# Patient Record
Sex: Female | Born: 1985 | Hispanic: Yes | Marital: Married | Smoking: Never smoker
Health system: Southern US, Community
[De-identification: ages and names within clinical notes are randomized; demographics above are authoritative.]

## PROBLEM LIST (undated history)

## (undated) DIAGNOSIS — R87629 Unspecified abnormal cytological findings in specimens from vagina: Secondary | ICD-10-CM

## (undated) HISTORY — PX: BREAST SURGERY: SHX581

## (undated) HISTORY — PX: AUGMENTATION MAMMAPLASTY: SUR837

---

## 2016-02-22 ENCOUNTER — Other Ambulatory Visit (HOSPITAL_COMMUNITY)
Admission: RE | Admit: 2016-02-22 | Discharge: 2016-02-22 | Disposition: A | Discharge status: Home / Self Care | Payer: BLUE CROSS/BLUE SHIELD | Source: Ambulatory Visit | Attending: Obstetrics & Gynecology | Admitting: Obstetrics & Gynecology

## 2016-02-22 ENCOUNTER — Other Ambulatory Visit: Payer: Self-pay | Admitting: Obstetrics & Gynecology

## 2016-02-22 DIAGNOSIS — Z01419 Encounter for gynecological examination (general) (routine) without abnormal findings: Secondary | ICD-10-CM | POA: Diagnosis present

## 2016-02-22 DIAGNOSIS — Z1151 Encounter for screening for human papillomavirus (HPV): Secondary | ICD-10-CM | POA: Diagnosis not present

## 2016-02-27 LAB — CYTOLOGY - PAP
DIAGNOSIS: NEGATIVE
HPV (WINDOPATH): NOT DETECTED

## 2016-03-04 NOTE — L&D Delivery Note (Signed)
Delivery Note Precipitous delivery, performed by Dr. Tenny Craw at 3:53 AM a viable female was delivered via Vaginal, Spontaneous Delivery (Presentation: OA ).  APGAR: 8, ; weight 4 lb 2.5 oz (1885 g).  I was then present for delivery of the placenta. Placenta status: to pathology Cord:  with the following complications: none .  Cord pH: N/A  Anesthesia:  none Episiotomy: None Lacerations: 2nd degree Suture Repair: 2.0 3.0 vicryl Est. Blood Loss (mL): 300  Mom to postpartum.  Baby to NICU.  Myna Hidalgo, M 12/16/2016, 4:16 AM

## 2016-06-12 LAB — OB RESULTS CONSOLE HEPATITIS B SURFACE ANTIGEN: Hepatitis B Surface Ag: NEGATIVE

## 2016-06-12 LAB — OB RESULTS CONSOLE RPR: RPR: NONREACTIVE

## 2016-06-12 LAB — OB RESULTS CONSOLE ABO/RH: RH Type: POSITIVE

## 2016-06-12 LAB — OB RESULTS CONSOLE PLATELET COUNT: Platelets: 210

## 2016-06-12 LAB — OB RESULTS CONSOLE HIV ANTIBODY (ROUTINE TESTING): HIV: NONREACTIVE

## 2016-06-12 LAB — OB RESULTS CONSOLE HGB/HCT, BLOOD
HEMATOCRIT: 37
HEMOGLOBIN: 12.3

## 2016-06-12 LAB — OB RESULTS CONSOLE RUBELLA ANTIBODY, IGM: Rubella: IMMUNE

## 2016-06-12 LAB — OB RESULTS CONSOLE ANTIBODY SCREEN: Antibody Screen: NEGATIVE

## 2016-10-21 LAB — OB RESULTS CONSOLE HGB/HCT, BLOOD
HCT: 32
HEMOGLOBIN: 11.3

## 2016-10-21 LAB — OB RESULTS CONSOLE PLATELET COUNT: PLATELETS: 247

## 2016-12-12 ENCOUNTER — Inpatient Hospital Stay (HOSPITAL_COMMUNITY): Payer: BLUE CROSS/BLUE SHIELD

## 2016-12-12 ENCOUNTER — Encounter (HOSPITAL_COMMUNITY): Payer: Self-pay | Admitting: *Deleted

## 2016-12-12 ENCOUNTER — Inpatient Hospital Stay (HOSPITAL_COMMUNITY)
Admission: AD | Admit: 2016-12-12 | Discharge: 2016-12-19 | DRG: 805 | Disposition: A | Discharge status: Home / Self Care | Payer: BLUE CROSS/BLUE SHIELD | Source: Ambulatory Visit | Attending: Obstetrics & Gynecology | Admitting: Obstetrics & Gynecology

## 2016-12-12 DIAGNOSIS — Z3A33 33 weeks gestation of pregnancy: Secondary | ICD-10-CM

## 2016-12-12 DIAGNOSIS — O42913 Preterm premature rupture of membranes, unspecified as to length of time between rupture and onset of labor, third trimester: Secondary | ICD-10-CM | POA: Diagnosis present

## 2016-12-12 DIAGNOSIS — O42919 Preterm premature rupture of membranes, unspecified as to length of time between rupture and onset of labor, unspecified trimester: Secondary | ICD-10-CM

## 2016-12-12 DIAGNOSIS — O1414 Severe pre-eclampsia complicating childbirth: Secondary | ICD-10-CM | POA: Diagnosis present

## 2016-12-12 HISTORY — DX: Unspecified abnormal cytological findings in specimens from vagina: R87.629

## 2016-12-12 LAB — URINALYSIS, ROUTINE W REFLEX MICROSCOPIC
Bilirubin Urine: NEGATIVE
GLUCOSE, UA: NEGATIVE mg/dL
HGB URINE DIPSTICK: NEGATIVE
KETONES UR: NEGATIVE mg/dL
Leukocytes, UA: NEGATIVE
NITRITE: NEGATIVE
PROTEIN: 100 mg/dL — AB
Specific Gravity, Urine: 1.029 (ref 1.005–1.030)
pH: 5 (ref 5.0–8.0)

## 2016-12-12 LAB — CBC
HEMATOCRIT: 31.5 % — AB (ref 36.0–46.0)
Hemoglobin: 10.7 g/dL — ABNORMAL LOW (ref 12.0–15.0)
MCH: 29.4 pg (ref 26.0–34.0)
MCHC: 34 g/dL (ref 30.0–36.0)
MCV: 86.5 fL (ref 78.0–100.0)
Platelets: 196 10*3/uL (ref 150–400)
RBC: 3.64 MIL/uL — ABNORMAL LOW (ref 3.87–5.11)
RDW: 13.3 % (ref 11.5–15.5)
WBC: 9.3 10*3/uL (ref 4.0–10.5)

## 2016-12-12 LAB — TYPE AND SCREEN
ABO/RH(D): B POS
ANTIBODY SCREEN: NEGATIVE

## 2016-12-12 LAB — POCT FERN TEST: POCT Fern Test: POSITIVE

## 2016-12-12 LAB — ABO/RH: ABO/RH(D): B POS

## 2016-12-12 MED ORDER — SODIUM CHLORIDE 0.9 % IV SOLN: 250.0000 mL | INTRAVENOUS | Status: DC | PRN

## 2016-12-12 MED ORDER — LACTATED RINGERS IV SOLN
INTRAVENOUS | Status: DC
  Administered 2016-12-12 – 2016-12-13 (×2): via INTRAVENOUS

## 2016-12-12 MED ORDER — NIFEDIPINE 10 MG PO CAPS
10.0000 mg | ORAL_CAPSULE | Freq: Four times a day (QID) | ORAL | Status: DC
  Administered 2016-12-13 – 2016-12-14 (×6): 10 mg via ORAL
  Filled 2016-12-12 (×7): qty 1

## 2016-12-12 MED ORDER — AMOXICILLIN 500 MG PO CAPS
500.0000 mg | ORAL_CAPSULE | Freq: Three times a day (TID) | ORAL | Status: DC
  Administered 2016-12-14 – 2016-12-15 (×2): 500 mg via ORAL
  Filled 2016-12-12 (×3): qty 1

## 2016-12-12 MED ORDER — ZOLPIDEM TARTRATE 5 MG PO TABS: 5.0000 mg | ORAL_TABLET | Freq: Every evening | ORAL | Status: DC | PRN

## 2016-12-12 MED ORDER — SODIUM CHLORIDE 0.9% FLUSH
3.0000 mL | Freq: Two times a day (BID) | INTRAVENOUS | Status: DC
  Administered 2016-12-13 – 2016-12-14 (×3): 3 mL via INTRAVENOUS

## 2016-12-12 MED ORDER — ACETAMINOPHEN 325 MG PO TABS: 650.0000 mg | ORAL_TABLET | ORAL | Status: DC | PRN

## 2016-12-12 MED ORDER — BETAMETHASONE SOD PHOS & ACET 6 (3-3) MG/ML IJ SUSP
12.0000 mg | INTRAMUSCULAR | Status: AC
  Administered 2016-12-12 – 2016-12-13 (×2): 12 mg via INTRAMUSCULAR
  Filled 2016-12-12 (×2): qty 2

## 2016-12-12 MED ORDER — NIFEDIPINE 10 MG PO CAPS
20.0000 mg | ORAL_CAPSULE | Freq: Once | ORAL | Status: AC
  Administered 2016-12-12: 20 mg via ORAL
  Filled 2016-12-12: qty 2

## 2016-12-12 MED ORDER — SODIUM CHLORIDE 0.9 % IV SOLN
2.0000 g | Freq: Four times a day (QID) | INTRAVENOUS | Status: AC
  Administered 2016-12-12 – 2016-12-14 (×8): 2 g via INTRAVENOUS
  Filled 2016-12-12 (×8): qty 2000

## 2016-12-12 MED ORDER — AZITHROMYCIN 250 MG PO TABS
500.0000 mg | ORAL_TABLET | Freq: Every day | ORAL | Status: DC
  Administered 2016-12-12 – 2016-12-14 (×3): 500 mg via ORAL
  Filled 2016-12-12 (×3): qty 2

## 2016-12-12 MED ORDER — CALCIUM CARBONATE ANTACID 500 MG PO CHEW
2.0000 | CHEWABLE_TABLET | ORAL | Status: DC | PRN
  Administered 2016-12-15: 400 mg via ORAL
  Filled 2016-12-12: qty 2

## 2016-12-12 MED ORDER — PRENATAL MULTIVITAMIN CH
1.0000 | ORAL_TABLET | Freq: Every day | ORAL | Status: DC
  Administered 2016-12-13: 1 via ORAL
  Filled 2016-12-12 (×2): qty 1

## 2016-12-12 MED ORDER — DOCUSATE SODIUM 100 MG PO CAPS
100.0000 mg | ORAL_CAPSULE | Freq: Every day | ORAL | Status: DC
  Administered 2016-12-13 – 2016-12-14 (×2): 100 mg via ORAL
  Filled 2016-12-12 (×2): qty 1

## 2016-12-12 MED ORDER — SODIUM CHLORIDE 0.9% FLUSH: 3.0000 mL | INTRAVENOUS | Status: DC | PRN

## 2016-12-12 NOTE — MAU Note (Signed)
Patient was at home on the couch when she felt a gush of clear fluid.  It soaked through her clothes onto the couch.  Patient was instructed to come to MAU for evaluation.  No pain.  Reports good fetal movement.  Felt another gush in triage.

## 2016-12-12 NOTE — Consult Note (Signed)
Neonatology Consult  Note:  At the request of the patients obstetrician Dr. Nelda Marseille I met with Karen Quinn (and husband) who is at 25 4 weeks currently with pregancy complicated by PPROM today.   We reviewed initial delivery room management, including CPAP, [place], and low but certainly possible need for intubation for surfactant administration.  We discussed feeding immaturity and need for full po intake with multiple days of good weight gain and no apnea or bradycardia before discharge.  We reviewed increased risk of jaundice, infection, and temperature instability.   Discussed likely length of stay.  Thank you for allowing [place] to participate in her care.  Please call with questions.  Higinio Roger, DO  Neonatologist   The total length of face-to-face or floor / unit time for this encounter was 20 minutes.  Counseling and / or coordination of care was greater than fifty percent of the time.

## 2016-12-12 NOTE — H&P (Signed)
HPI: 31 y/o G1P0 @ [redacted]w[redacted]d estimated gestational age (as dated by LMP c/w 20 week ultrasound) presents complaining of rupture and gush of fluid around 1330 today.  Since then she has also noted some irregular contractions, maybe every 10-66min, but seem to have started to space out. no Vaginal Bleeding,   + Fetal Movement.  Prenatal care has been provided by Dr. Charlotta Newton  ROS:  CONSTITUTIONAL: No fever or chills HEENT: no headache, no blurr vision CARDIOLOGY: No chest pain.  No peripheral edema. RESPIRATORY: no Shortness of breath. no Cough.  UROLOGY: no Urinary frequency. no Urinary incontinence. no Urinary urgency.  GASTROENTEROLOGY: no Abdominal pain. no Appetite change. no Change in bowel movements.  FEMALE REPRODUCTIVE: no Breast lumps or discharge. no Breast pain. NEUROLOGY: no Dizziness.  no Loss of consciousness.  PSYCHOLOGY: no Anxiety. no Depression.  SKIN: no Rash. no Hives.    Pregnancy uncomplicated   Prenatal Transfer Tool  Maternal Diabetes: No Genetic Screening: Normal Maternal Ultrasounds/Referrals: Normal Fetal Ultrasounds or other Referrals:  None Maternal Substance Abuse:  No Significant Maternal Medications:  None Significant Maternal Lab Results: Lab values include: Other: none   PNL:  GBS unknown, Rub Immune, Hep B neg, RPR NR, HIV neg, GC/C neg, glucola:137 Hgb: 11.3 Blood type: B positive, antibody neg  Immunizations: Tdap: 9/25 Flu: declined  OBHx: primip PMHx:  none Meds:  PNV Allergy:  No Known Allergies SurgHx: none SocHx:   no Tobacco, no  EtOH, no Illicit Drugs  O: BP 120/72 (BP Location: Left Arm)   Pulse 63   Temp 98.2 F (36.8 C) (Oral)   Resp 16   Ht 5' (1.524 m)   Wt 128 lb (58.1 kg)   SpO2 100%   BMI 25.00 kg/m  Gen. AAOx3, NAD Neck: normal appearance CV.  RRR Resp. Normal respiratory rate and effort Abd. Gravid,  no tenderness,  no rigidity,  no guarding Extr.  no edema B/L , no calf tenderness, neg Homan's B/L  FHT: 130  baseline, moderate variability, + accels,  no decels Toco: irregular- q 3-69min SVE: deferred  SSE: per MAU- grossly ruptured  Labs: see orders  A/P:  31 y.o. G1P0 @ [redacted]w[redacted]d EGA who presents for PPROM -FWB:  Cat. I, plan for continuous monitoring for first 24-48hr -PPROM:   Started on antibiotic course- IV Amox and Azithro  Plan for betamethasone course- first shot @ 1700  Procardia given for tocolysis  Continue IV fluids, if contractions space out ok to saline lock  UA and GBS collected  Currently no evidence of infection  [place] ordered for fetal growth and to assess fluid level -s/p NICU consult  DISPO: Expectant management, plan for delivery at 34wks or if clinically indicated  Myna Hidalgo, DO [phone] (pager) [phone] (office)

## 2016-12-12 NOTE — MAU Provider Note (Signed)
S: Ms. Karen Quinn is a 31 y.o. G1P0 at [redacted]w[redacted]d  who presents to MAU today complaining of leaking of fluid since 1330 today. She denies vaginal bleeding. She denies contractions. She reports normal fetal movement.    O: BP 122/79   Pulse 78   Temp 98.4 F (36.9 C) (Oral)   Resp 16   Ht 5' (1.524 m)   Wt 128 lb (58.1 kg)   SpO2 96%   BMI 25.00 kg/m  GENERAL: Well-developed, well-nourished female in no acute distress.  HEAD: Normocephalic, atraumatic.  CHEST: Normal effort of breathing, regular heart rate ABDOMEN: Soft, nontender, gravid PELVIC: Normal external female genitalia. Vagina is pink and rugated. Cervix with normal contour, no lesions. Normal discharge.  Positive pooling; moderate amount of clear watery fluid.   Cervical exam: deferred     Fetal Monitoring: Baseline: 135 Variability: moderate Accelerations: 15x15 Decelerations: none Contractions: irr ctx/ui  Results for orders placed or performed during the hospital encounter of 12/12/16 (from the past 24 hour(s))  Urinalysis, Routine w reflex microscopic     Status: Abnormal   Collection Time: 12/12/16  2:55 PM  Result Value Ref Range   Color, Urine YELLOW YELLOW   APPearance HAZY (A) CLEAR   Specific Gravity, Urine 1.029 1.005 - 1.030   pH 5.0 5.0 - 8.0   Glucose, UA NEGATIVE NEGATIVE mg/dL   Hgb urine dipstick NEGATIVE NEGATIVE   Bilirubin Urine NEGATIVE NEGATIVE   Ketones, ur NEGATIVE NEGATIVE mg/dL   Protein, ur 409 (A) NEGATIVE mg/dL   Nitrite NEGATIVE NEGATIVE   Leukocytes, UA NEGATIVE NEGATIVE   RBC / HPF 0-5 0 - 5 RBC/hpf   WBC, UA 0-5 0 - 5 WBC/hpf   Bacteria, UA RARE (A) NONE SEEN   Squamous Epithelial / LPF 0-5 (A) NONE SEEN   Mucus PRESENT   Fern Test     Status: None   Collection Time: 12/12/16  3:59 PM  Result Value Ref Range   POCT Fern Test Positive = ruptured amniotic membanes      A: SIUP at [redacted]w[redacted]d  1. Preterm premature rupture of membranes    P: Dr. Charlotta Newton notified of exam. Will  place admission orders due to PPROM.  Discussed results with patient  Judeth Horn, NP 12/12/2016 3:29 PM

## 2016-12-13 NOTE — Progress Notes (Signed)
HD #1- PPROM at [redacted]w[redacted]d, currently [redacted]w[redacted]d  S: Patient resting comfortable.  No contractions, no VB, +FM.  Still leaking fluid.  No fever/chills.  Tolerating gen diet.  No acute complaints.  O: BP (!) 101/59 (BP Location: Left Arm)   Pulse 79   Temp 98 F (36.7 C) (Oral)   Resp 17   Ht 5' (1.524 m)   Wt 128 lb (58.1 kg)   SpO2 99%   BMI 25.00 kg/m   Gen: NAD CV: RRR Lungs: CTAB Abd: gravid,non-tender Ext: SCDs in place, no edema, no calf tenderness  Fetal tracings: 120, moderate variability, +accels, no decels Contractions: none   A: 31yo G1P0@ [redacted]w[redacted]d for PPROM @ [redacted]w[redacted]d -stable, no evidence of chorioamnionitis -Fetal well being reassuring   Plan: -currently on antibiotic therapy -Tocolysis- Procardia -Betamethosone course- 2nd dose today -will transition to q shift monitoring as FHT have been reassuring overnight -S/p NICU consult  Continue expectant management, plan for delivery at 34wk or pending discussion with MFM  Myna Hidalgo, DO [phone] (pager) [phone] (office)

## 2016-12-14 LAB — CULTURE, BETA STREP (GROUP B ONLY)

## 2016-12-14 NOTE — Progress Notes (Signed)
HD #2- PPROM at [redacted]w[redacted]d, currently [redacted]w[redacted]d  S: Patient resting comfortable.  No contractions, no VB, +FM.  Still leaking fluid.  No fever/chills.  Tolerating gen diet.  No acute complaints or changes overnight  O: BP 109/70 (BP Location: Right Arm)   Pulse 84   Temp 99.1 F (37.3 C) (Oral)   Resp 16   Ht 5' (1.524 m)   Wt 128 lb (58.1 kg)   SpO2 98%   BMI 25.00 kg/m   Gen: NAD CV: RRR Lungs: CTAB Abd: gravid,non-tender Ext: SCDs in place, no edema, no calf tenderness  Fetal tracings: 125, moderate variability, +accels, no decels Contractions: occasional SVE: Deferred   A: 31yo G1P0@ [redacted]w[redacted]d for PPROM  -stable, no evidence of chorioamnionitis -Fetal well being reassuring   Plan: -currently on antibiotic therapy -s/p tocolysis -s/p betamethasone course -NST q shift -S/p NICU consult  Continue expectant management, plan for delivery at 34wk   Myna Hidalgo, DO [phone] (pager) [phone] (office)

## 2016-12-14 NOTE — Plan of Care (Signed)
Problem: Education: Goal: Knowledge of disease or condition will improve Outcome: Progressing Patient knowledgeable of potential delivery tomorrow at 34 weeks.    Problem: Coping: Goal: Level of anxiety will decrease Outcome: Not Progressing Patient tearful after discussion with Dr. Nelda Marseille regarding potential delivery tomorrow.  Goal: Ability to identify and develop effective coping behavior will improve Outcome: Progressing Patient tearful but demonstrating adequate coping behaviors regarding condition.  Goal: Participation in decision-making will improve Outcome: Progressing Patient and FOB actively participating in decision making progress.  Upon assessment, patient was currently thinking over possible delivery tomorrow (per Dr. Nelda Marseille).   Problem: Physical Regulation: Goal: Complications related to the disease process, condition or treatment will be avoided or minimized Outcome: Progressing Monitoring vaginal discharge.  Patient reports pinkish clear fluid loss.   Problem: Pain Management: Goal: Relief or control of pain will improve Outcome: Completed/Met Date Met: 12/14/16 Patient denies pain

## 2016-12-15 LAB — CBC
HEMATOCRIT: 24.8 % — AB (ref 36.0–46.0)
Hemoglobin: 8.3 g/dL — ABNORMAL LOW (ref 12.0–15.0)
MCH: 29.5 pg (ref 26.0–34.0)
MCHC: 33.5 g/dL (ref 30.0–36.0)
MCV: 88.3 fL (ref 78.0–100.0)
Platelets: 156 10*3/uL (ref 150–400)
RBC: 2.81 MIL/uL — ABNORMAL LOW (ref 3.87–5.11)
RDW: 13.7 % (ref 11.5–15.5)
WBC: 9 10*3/uL (ref 4.0–10.5)

## 2016-12-15 LAB — TYPE AND SCREEN
ABO/RH(D): B POS
ANTIBODY SCREEN: NEGATIVE

## 2016-12-15 MED ORDER — LACTATED RINGERS IV SOLN: 500.0000 mL | INTRAVENOUS | Status: DC | PRN

## 2016-12-15 MED ORDER — OXYCODONE-ACETAMINOPHEN 5-325 MG PO TABS: 2.0000 | ORAL_TABLET | ORAL | Status: DC | PRN

## 2016-12-15 MED ORDER — OXYTOCIN 40 UNITS IN LACTATED RINGERS INFUSION - SIMPLE MED
1.0000 m[IU]/min | INTRAVENOUS | Status: DC
  Administered 2016-12-15: 2 m[IU]/min via INTRAVENOUS

## 2016-12-15 MED ORDER — LACTATED RINGERS IV SOLN
INTRAVENOUS | Status: DC
  Administered 2016-12-15: 10:00:00 via INTRAVENOUS

## 2016-12-15 MED ORDER — LIDOCAINE HCL (PF) 1 % IJ SOLN
30.0000 mL | INTRAMUSCULAR | Status: AC | PRN
  Administered 2016-12-16: 30 mL via SUBCUTANEOUS
  Filled 2016-12-15: qty 30

## 2016-12-15 MED ORDER — MISOPROSTOL 200 MCG PO TABS
50.0000 ug | ORAL_TABLET | ORAL | Status: DC | PRN
  Administered 2016-12-15 (×3): 50 ug via ORAL
  Filled 2016-12-15 (×3): qty 1

## 2016-12-15 MED ORDER — ONDANSETRON HCL 4 MG/2ML IJ SOLN
4.0000 mg | Freq: Four times a day (QID) | INTRAMUSCULAR | Status: DC | PRN
Start: 2016-12-15 — End: 2016-12-16

## 2016-12-15 MED ORDER — FAMOTIDINE IN NACL 20-0.9 MG/50ML-% IV SOLN
20.0000 mg | Freq: Once | INTRAVENOUS | Status: AC
  Administered 2016-12-15: 20 mg via INTRAVENOUS
  Filled 2016-12-15: qty 50

## 2016-12-15 MED ORDER — SOD CITRATE-CITRIC ACID 500-334 MG/5ML PO SOLN: 30.0000 mL | ORAL | Status: DC | PRN

## 2016-12-15 MED ORDER — OXYTOCIN 40 UNITS IN LACTATED RINGERS INFUSION - SIMPLE MED
2.5000 [IU]/h | INTRAVENOUS | Status: DC
  Administered 2016-12-16: 2.5 [IU]/h via INTRAVENOUS
  Filled 2016-12-15: qty 1000

## 2016-12-15 MED ORDER — ACETAMINOPHEN 325 MG PO TABS: 650.0000 mg | ORAL_TABLET | ORAL | Status: DC | PRN

## 2016-12-15 MED ORDER — ZOLPIDEM TARTRATE 5 MG PO TABS: 5.0000 mg | ORAL_TABLET | Freq: Every evening | ORAL | Status: DC | PRN

## 2016-12-15 MED ORDER — TERBUTALINE SULFATE 1 MG/ML IJ SOLN: 0.2500 mg | Freq: Once | INTRAMUSCULAR | Status: DC | PRN

## 2016-12-15 MED ORDER — OXYCODONE-ACETAMINOPHEN 5-325 MG PO TABS: 1.0000 | ORAL_TABLET | ORAL | Status: DC | PRN

## 2016-12-15 MED ORDER — OXYTOCIN BOLUS FROM INFUSION
500.0000 mL | Freq: Once | INTRAVENOUS | Status: AC
  Administered 2016-12-16: 500 mL via INTRAVENOUS

## 2016-12-15 NOTE — Anesthesia Pain Management Evaluation Note (Signed)
  CRNA Pain Management Visit Note  Patient: Karen Quinn, 31 y.o., female  "Hello I am a member of the anesthesia team at [facility]. We have an anesthesia team available at all times to provide care throughout the hospital, including epidural management and anesthesia for C-section. I don't know your plan for the delivery whether it a natural birth, water birth, IV sedation, nitrous supplementation, doula or epidural, but we want to meet your pain goals."   1.Was your pain managed to your expectations on prior hospitalizations?   No prior hospitalizations  2.What is your expectation for pain management during this hospitalization?     Labor support without medications and Epidural  3.How can we help you reach that goal? Patient would like to try natural, but is open to epidural  Record the patient's initial score and the patient's pain goal.   Pain: 0  Pain Goal: 10 [facility] wants you to be able to say your pain was always managed very well.  Rica Records 12/15/2016

## 2016-12-15 NOTE — Progress Notes (Signed)
Pt placed on monitor at 2156. Monitor continuously picking up mom's HR. Monitor switched out and place patient on different monitor for a more accurate tracing. Pt began to get tearful and worried. Emotional support given.  Reassured that baby was fine. Charge nurse notified and came in to assist when monitor was picking up HR in the 60s which was the patient's heart rate. Verified by radial pulse. FOB at bedside. Pt resting at this time. No acute distress noted. Will continue to monitor.

## 2016-12-15 NOTE — Progress Notes (Signed)
OB PN:  S: Pt resting comfortably, no acute complaints  O: BP 139/84 (BP Location: Right Arm)   Pulse 64   Temp 98.7 F (37.1 C) (Oral)   Resp 18   Ht 5' (1.524 m)   Wt 128 lb (58.1 kg)   SpO2 99%   BMI 25.00 kg/m   FHT: 135bpm, moderate variablity, + accels, no decels Toco: rare SVE: deferred  A/P: 31 y.o. G1P0 @ [redacted]w[redacted]d for IOL due to PPROM 1. FWB: Cat. I 2. Labor: plan for cytotec for cervical ripening Pain: IV or epidural upon request GBS: neg  Myna Hidalgo, DO [phone] (pager) [phone] (office)

## 2016-12-15 NOTE — Progress Notes (Addendum)
HD #2- PPROM at [redacted]w[redacted]d, currently [redacted]w[redacted]d  S: Patient resting comfortable.  No contractions, no VB, +FM.  Still leaking fluid.  No fever/chills.  Tolerating gen diet.  No acute complaints.  O: BP (!) 143/76 (BP Location: Right Arm) Comment: RN Notified  Pulse (!) 59 Comment: RN Notified  Temp 98.2 F (36.8 C) (Oral)   Resp 18   Ht 5' (1.524 m)   Wt 128 lb (58.1 kg)   SpO2 99%   BMI 25.00 kg/m   Gen: NAD CV: RRR Lungs: CTAB Abd: gravid,non-tender Ext: SCDs in place, no edema, no calf tenderness  Fetal tracings: 125, moderate variability, +accels, no decels Contractions: mild irritability   A: 31yo G1P0@ [redacted]w[redacted]d for PPROM @ [redacted]w[redacted]d -stable, no evidence of chorioamnionitis -Fetal well being reassuring  Plan: -s/p IV antibiotics, currently on oral antibiotics -s/p tocolysis -s/p betamethasone course -NST q shift -S/p NICU consult  At bedside to discuss plan of care- discussed that guidelines suggest to proceed with delivery at 34wk.  Risk, benefit and alternatives discussed with patient and her husband.  Questions and concerns were addressed regarding Pitocin, induction and what it would mean if they decided to wait.  Discussed with patient that while she currently does not have evidence of infection and that she has not been ruptured for a prolonged period (greater than a week), it does not mean that an infection may not be present as this is often the cause of early rupture.  Again reviewed pros/cons of delivery @ 34wks in terms of the baby and reminded pt that at this point she has already received BMZ to help the fetal lungs mature.  Pt and her husband wish to think more about induction at this time.  Will wait to hear from them regarding their decision.  ADDENDUM: They have decided to proceed with IOL.  CBC, T&S to be collected.  Plan for IOL with oral cytotec  Myna Hidalgo, DO [phone] (cell) [phone] (office)

## 2016-12-16 ENCOUNTER — Encounter (HOSPITAL_COMMUNITY): Payer: Self-pay | Admitting: Anesthesiology

## 2016-12-16 ENCOUNTER — Encounter (HOSPITAL_COMMUNITY): Payer: Self-pay | Admitting: *Deleted

## 2016-12-16 LAB — COMPREHENSIVE METABOLIC PANEL
ALBUMIN: 2.3 g/dL — AB (ref 3.5–5.0)
ALBUMIN: 2.1 g/dL — AB (ref 3.5–5.0)
ALK PHOS: 149 U/L — AB (ref 38–126)
ALT: 164 U/L — ABNORMAL HIGH (ref 14–54)
ALT: 174 U/L — ABNORMAL HIGH (ref 14–54)
ALT: 195 U/L — AB (ref 14–54)
ANION GAP: 7 (ref 5–15)
ANION GAP: 9 (ref 5–15)
ANION GAP: 9 (ref 5–15)
AST: 154 U/L — ABNORMAL HIGH (ref 15–41)
AST: 160 U/L — ABNORMAL HIGH (ref 15–41)
AST: 158 U/L — AB (ref 15–41)
Albumin: 2.7 g/dL — ABNORMAL LOW (ref 3.5–5.0)
Alkaline Phosphatase: 163 U/L — ABNORMAL HIGH (ref 38–126)
Alkaline Phosphatase: 135 U/L — ABNORMAL HIGH (ref 38–126)
BILIRUBIN TOTAL: 0.4 mg/dL (ref 0.3–1.2)
BILIRUBIN TOTAL: 0.4 mg/dL (ref 0.3–1.2)
BUN: 11 mg/dL (ref 6–20)
BUN: 12 mg/dL (ref 6–20)
BUN: 12 mg/dL (ref 6–20)
CALCIUM: 6.8 mg/dL — AB (ref 8.9–10.3)
CALCIUM: 8.2 mg/dL — AB (ref 8.9–10.3)
CO2: 18 mmol/L — ABNORMAL LOW (ref 22–32)
CO2: 19 mmol/L — ABNORMAL LOW (ref 22–32)
CO2: 21 mmol/L — ABNORMAL LOW (ref 22–32)
CREATININE: 0.55 mg/dL (ref 0.44–1.00)
Calcium: 7.9 mg/dL — ABNORMAL LOW (ref 8.9–10.3)
Chloride: 108 mmol/L (ref 101–111)
Chloride: 107 mmol/L (ref 101–111)
Chloride: 106 mmol/L (ref 101–111)
Creatinine, Ser: 0.56 mg/dL (ref 0.44–1.00)
Creatinine, Ser: 0.48 mg/dL (ref 0.44–1.00)
GFR calc Af Amer: >60 mL/min (ref >60)
GFR calc Af Amer: >60 mL/min (ref >60)
GFR calc Af Amer: >60 mL/min (ref >60)
GFR calc non Af Amer: >60 mL/min (ref >60)
GLUCOSE: 82 mg/dL (ref 65–99)
GLUCOSE: 89 mg/dL (ref 65–99)
Glucose, Bld: 94 mg/dL (ref 65–99)
POTASSIUM: 3.4 mmol/L — AB (ref 3.5–5.1)
Potassium: 3.8 mmol/L (ref 3.5–5.1)
Potassium: 3.5 mmol/L (ref 3.5–5.1)
SODIUM: 134 mmol/L — AB (ref 135–145)
SODIUM: 135 mmol/L (ref 135–145)
Sodium: 135 mmol/L (ref 135–145)
TOTAL PROTEIN: 4.5 g/dL — AB (ref 6.5–8.1)
TOTAL PROTEIN: 4.8 g/dL — AB (ref 6.5–8.1)
TOTAL PROTEIN: 5.1 g/dL — AB (ref 6.5–8.1)
Total Bilirubin: 0.3 mg/dL (ref 0.3–1.2)

## 2016-12-16 LAB — CBC
HEMATOCRIT: 24.1 % — AB (ref 36.0–46.0)
HEMATOCRIT: 26.9 % — AB (ref 36.0–46.0)
HEMOGLOBIN: 8.2 g/dL — AB (ref 12.0–15.0)
HEMOGLOBIN: 9 g/dL — AB (ref 12.0–15.0)
MCH: 29.1 pg (ref 26.0–34.0)
MCH: 29.7 pg (ref 26.0–34.0)
MCHC: 33.5 g/dL (ref 30.0–36.0)
MCHC: 34 g/dL (ref 30.0–36.0)
MCV: 87.1 fL (ref 78.0–100.0)
MCV: 87.3 fL (ref 78.0–100.0)
Platelets: 168 10*3/uL (ref 150–400)
Platelets: 151 10*3/uL (ref 150–400)
RBC: 2.76 MIL/uL — AB (ref 3.87–5.11)
RBC: 3.09 MIL/uL — AB (ref 3.87–5.11)
RDW: 13.7 % (ref 11.5–15.5)
RDW: 13.6 % (ref 11.5–15.5)
WBC: 10.1 10*3/uL (ref 4.0–10.5)
WBC: 9.5 10*3/uL (ref 4.0–10.5)

## 2016-12-16 LAB — RPR: RPR: NONREACTIVE

## 2016-12-16 LAB — URIC ACID: Uric Acid, Serum: 4.4 mg/dL (ref 2.3–6.6)

## 2016-12-16 LAB — PROTEIN / CREATININE RATIO, URINE
Creatinine, Urine: 106 mg/dL
Protein Creatinine Ratio: 4.81 mg/mg{Cre} — ABNORMAL HIGH (ref 0.00–0.15)
Total Protein, Urine: 510 mg/dL

## 2016-12-16 LAB — MAGNESIUM: Magnesium: 4.9 mg/dL — ABNORMAL HIGH (ref 1.7–2.4)

## 2016-12-16 MED ORDER — PRENATAL MULTIVITAMIN CH
1.0000 | ORAL_TABLET | Freq: Every day | ORAL | Status: DC
  Administered 2016-12-16 – 2016-12-18 (×3): 1 via ORAL
  Filled 2016-12-16 (×3): qty 1

## 2016-12-16 MED ORDER — MAGNESIUM SULFATE BOLUS VIA INFUSION
4.0000 g | Freq: Once | INTRAVENOUS | Status: AC
  Administered 2016-12-16: 4 g via INTRAVENOUS
  Filled 2016-12-16: qty 500

## 2016-12-16 MED ORDER — ONDANSETRON HCL 4 MG/2ML IJ SOLN: 4.0000 mg | INTRAMUSCULAR | Status: DC | PRN

## 2016-12-16 MED ORDER — PHENYLEPHRINE 40 MCG/ML (10ML) SYRINGE FOR IV PUSH (FOR BLOOD PRESSURE SUPPORT)
PREFILLED_SYRINGE | INTRAVENOUS | Status: AC
  Filled 2016-12-16: qty 10

## 2016-12-16 MED ORDER — BENZOCAINE-MENTHOL 20-0.5 % EX AERO
1.0000 "application " | INHALATION_SPRAY | CUTANEOUS | Status: DC | PRN
  Administered 2016-12-16: 1 via TOPICAL
  Filled 2016-12-16: qty 56

## 2016-12-16 MED ORDER — ONDANSETRON HCL 4 MG PO TABS: 4.0000 mg | ORAL_TABLET | ORAL | Status: DC | PRN

## 2016-12-16 MED ORDER — MISOPROSTOL 200 MCG PO TABS
ORAL_TABLET | ORAL | Status: AC
  Filled 2016-12-16: qty 4

## 2016-12-16 MED ORDER — EPHEDRINE 5 MG/ML INJ
10.0000 mg | INTRAVENOUS | Status: DC | PRN
  Filled 2016-12-16: qty 2

## 2016-12-16 MED ORDER — DIPHENHYDRAMINE HCL 25 MG PO CAPS: 25.0000 mg | ORAL_CAPSULE | Freq: Four times a day (QID) | ORAL | Status: DC | PRN

## 2016-12-16 MED ORDER — FENTANYL 2.5 MCG/ML BUPIVACAINE 1/10 % EPIDURAL INFUSION (WH - ANES)
INTRAMUSCULAR | Status: DC
Start: 2016-12-16 — End: 2016-12-16
  Filled 2016-12-16: qty 100

## 2016-12-16 MED ORDER — DIPHENHYDRAMINE HCL 50 MG/ML IJ SOLN: 12.5000 mg | INTRAMUSCULAR | Status: DC | PRN

## 2016-12-16 MED ORDER — FENTANYL 2.5 MCG/ML BUPIVACAINE 1/10 % EPIDURAL INFUSION (WH - ANES): 14.0000 mL/h | INTRAMUSCULAR | Status: DC | PRN

## 2016-12-16 MED ORDER — SIMETHICONE 80 MG PO CHEW
80.0000 mg | CHEWABLE_TABLET | ORAL | Status: DC | PRN
  Administered 2016-12-17: 80 mg via ORAL
  Filled 2016-12-16: qty 1

## 2016-12-16 MED ORDER — IBUPROFEN 600 MG PO TABS
600.0000 mg | ORAL_TABLET | Freq: Four times a day (QID) | ORAL | Status: DC
  Administered 2016-12-16 – 2016-12-19 (×12): 600 mg via ORAL
  Filled 2016-12-16 (×13): qty 1

## 2016-12-16 MED ORDER — ACETAMINOPHEN 325 MG PO TABS
650.0000 mg | ORAL_TABLET | ORAL | Status: DC | PRN
  Administered 2016-12-16: 650 mg via ORAL
  Filled 2016-12-16: qty 2

## 2016-12-16 MED ORDER — MAGNESIUM SULFATE 40 G IN LACTATED RINGERS - SIMPLE
2.0000 g/h | INTRAVENOUS | Status: AC
  Administered 2016-12-16: 2 g/h via INTRAVENOUS
  Filled 2016-12-16: qty 40
  Filled 2016-12-16: qty 500

## 2016-12-16 MED ORDER — PHENYLEPHRINE 40 MCG/ML (10ML) SYRINGE FOR IV PUSH (FOR BLOOD PRESSURE SUPPORT)
80.0000 ug | PREFILLED_SYRINGE | INTRAVENOUS | Status: DC | PRN
  Filled 2016-12-16: qty 5

## 2016-12-16 MED ORDER — WITCH HAZEL-GLYCERIN EX PADS: 1.0000 "application " | MEDICATED_PAD | CUTANEOUS | Status: DC | PRN

## 2016-12-16 MED ORDER — LABETALOL HCL 5 MG/ML IV SOLN
20.0000 mg | INTRAVENOUS | Status: DC | PRN
  Administered 2016-12-18: 20 mg via INTRAVENOUS
  Filled 2016-12-16 (×2): qty 4

## 2016-12-16 MED ORDER — DIBUCAINE 1 % RE OINT: 1.0000 "application " | TOPICAL_OINTMENT | RECTAL | Status: DC | PRN

## 2016-12-16 MED ORDER — ZOLPIDEM TARTRATE 5 MG PO TABS: 5.0000 mg | ORAL_TABLET | Freq: Every evening | ORAL | Status: DC | PRN

## 2016-12-16 MED ORDER — COCONUT OIL OIL
1.0000 "application " | TOPICAL_OIL | Status: DC | PRN
  Administered 2016-12-16: 1 via TOPICAL
  Filled 2016-12-16: qty 120

## 2016-12-16 MED ORDER — HYDRALAZINE HCL 20 MG/ML IJ SOLN: 10.0000 mg | Freq: Once | INTRAMUSCULAR | Status: DC | PRN

## 2016-12-16 MED ORDER — LACTATED RINGERS IV SOLN
INTRAVENOUS | Status: DC
  Administered 2016-12-16: 18:00:00 via INTRAVENOUS

## 2016-12-16 MED ORDER — SENNOSIDES-DOCUSATE SODIUM 8.6-50 MG PO TABS
2.0000 | ORAL_TABLET | ORAL | Status: DC
  Administered 2016-12-16 – 2016-12-18 (×2): 2 via ORAL
  Filled 2016-12-16 (×3): qty 2

## 2016-12-16 MED ORDER — LACTATED RINGERS IV SOLN: 500.0000 mL | Freq: Once | INTRAVENOUS | Status: DC

## 2016-12-16 NOTE — Progress Notes (Signed)
Spoke with Myna Hidalgo MD regarding patients most recent lab value, PCR ratio. Phone call transferred into patients room for her to speak with patient and husband. Will continue to monitor.  Irving Burton Oval Cavazos RN

## 2016-12-16 NOTE — Progress Notes (Signed)
RN called with results of lab work suggestive of preeclampsia.  Spoke with patient/husband over the phone- currently reports no headache, no blurry vision, no RUQ pain.  Previously noted mid upper abdominal pain that has improved following IV pepcid.  Reviewed preeclampsia discussed management,which is to proceed with delivery.  Reviewed treatment of IV magnesium either following delivery or during delivery should she become symptomatic, blood pressure increase or worsening of labs.  Will also plan to obtain repeat CBC and consider placement of epidural now due to concern for declining platelets.  Husband voiced understanding.  All questions and concerns were addressed.   Results for orders placed or performed during the hospital encounter of 12/12/16 (from the past 24 hour(s))  Type and screen [facility] [place]     Status: None   Collection Time: 12/15/16 10:00 AM  Result Value Ref Range   ABO/RH(D) B POS    Antibody Screen NEG    Sample Expiration 12/18/2016   CBC     Status: Abnormal   Collection Time: 12/15/16 11:32 AM  Result Value Ref Range   WBC 9.0 4.0 - 10.5 K/uL   RBC 2.81 (L) 3.87 - 5.11 MIL/uL   Hemoglobin 8.3 (L) 12.0 - 15.0 g/dL   HCT 16.1 (L) 09.6 - 04.5 %   MCV 88.3 78.0 - 100.0 fL   MCH 29.5 26.0 - 34.0 pg   MCHC 33.5 30.0 - 36.0 g/dL   RDW 40.9 81.1 - 91.4 %   Platelets 156 150 - 400 K/uL  Protein / creatinine ratio, urine     Status: Abnormal   Collection Time: 12/16/16 12:28 AM  Result Value Ref Range   Creatinine, Urine 106.00 mg/dL   Total Protein, Urine 510 mg/dL   Protein Creatinine Ratio 4.81 (H) 0.00 - 0.15 mg/mg[Cre]  Comprehensive metabolic panel     Status: Abnormal   Collection Time: 12/16/16 12:34 AM  Result Value Ref Range   Sodium 135 135 - 145 mmol/L   Potassium 3.8 3.5 - 5.1 mmol/L   Chloride 108 101 - 111 mmol/L   CO2 18 (L) 22 - 32 mmol/L   Glucose, Bld 82 65 - 99 mg/dL   BUN 12 6 - 20 mg/dL   Creatinine, Ser 7.82 0.44 - 1.00  mg/dL   Calcium 8.2 (L) 8.9 - 10.3 mg/dL   Total Protein 5.1 (L) 6.5 - 8.1 g/dL   Albumin 2.7 (L) 3.5 - 5.0 g/dL   AST 956 (H) 15 - 41 U/L   ALT 164 (H) 14 - 54 U/L   Alkaline Phosphatase 163 (H) 38 - 126 U/L   Total Bilirubin 0.3 0.3 - 1.2 mg/dL   GFR calc non Af Amer >60 >60 mL/min   GFR calc Af Amer >60 >60 mL/min   Anion gap 9 5 - 15  Uric acid     Status: None   Collection Time: 12/16/16 12:34 AM  Result Value Ref Range   Uric Acid, Serum 4.4 2.3 - 6.6 mg/dL   Myna Hidalgo, DO [phone] (cell) [phone] (office)

## 2016-12-16 NOTE — Progress Notes (Signed)
Postpartum day #0, NSVD  Subjective Pt with c/o blurry vision, difficulty focusing.  Denies HA, visual changes, upper abdominal pain.  Lochia normal.  Pain controlled.  Breast feeding-pumping.  Temp:  [98 F (36.7 C)-98.8 F (37.1 C)] 98 F (36.7 C) (10/15 1112) Pulse Rate:  [59-168] 65 (10/15 1220) Resp:  [16-18] 18 (10/15 1220) BP: (100-153)/(54-95) 132/83 (10/15 1220) SpO2:  [93 %-97 %] 95 % (10/15 1220)  UOP  ~500 in hat   Gen:  NAD, A&O x 3 CV:  RRR Lungs:  CTA bilaterally.  BS not heard well at bases but no crackles, rales rhonchi. Uterine fundus:  Firm, nontender Lochia normal Ext:  +Edema, no calf tenderness bilaterally Neuro: DTR3+, no clonus.  CBC    Component Value Date/Time   WBC 10.1 12/16/2016 0630   RBC 2.76 (L) 12/16/2016 0630   HGB 8.2 (L) 12/16/2016 0630   HGB 11.3 10/21/2016   HCT 24.1 (L) 12/16/2016 0630   HCT 32 10/21/2016   PLT 151 12/16/2016 0630   PLT 247 10/21/2016   MCV 87.3 12/16/2016 0630   MCH 29.7 12/16/2016 0630   MCHC 34.0 12/16/2016 0630   RDW 13.6 12/16/2016 0630    AST/ALT- 160/174 up from 154/164 A/P:  S/p SVD.  Baby in NICU. Preeclampsia with severe features.  BPs normal.  LFTS elevated.  -On magnesium for seizure prophylaxis.  Visual changes  suggestive of magnesium toxicity.  Check Magnesium with LFTs  now.  -Strict I/Os.  Monitor UOP closely.  -Discontinue Magnesium at 0400 tomorrow. Routine postpartum care. Lactation support. Dr. Charlotta Newton to resume care at 7 am on 12/17/2016.  Geryl Rankins 12/16/2016, 1:36 PM

## 2016-12-16 NOTE — Progress Notes (Signed)
UOP reviewed. D/w RN regarding UOP due to Magnesium infusion.  Will discuss with technician to make sure I/Os as documented are correct.  Magnesium 4.9.  LFTs increased slightly.  Strict I/Os ordered.  Discussed importance of accurate UOP.

## 2016-12-16 NOTE — Lactation Note (Signed)
This note was copied from a baby's chart. Lactation Consultation Note  Patient Name: Karen Quinn PRAFO'A Date: 12/16/2016   Attempted to visit with mom for consult. Mom was in a deep sleep. Pumping kit and pump left in room. Meghan Hoppenbauer RN aware that pump needs to be set up when mom awakens. Will follow up at a later time.      Maternal Data    Feeding    LATCH Score                   Interventions    Lactation Tools Discussed/Used     Consult Status      Donn Pierini 12/16/2016, 9:53 AM

## 2016-12-16 NOTE — Lactation Note (Signed)
This note was copied from a baby's chart. Lactation Consultation Note  Patient Name: Karen Quinn ZOXWR'[id number] Date: 12/16/2016 Reason for consult: Initial assessment;Late-preterm 34-36.6wks;NICU baby;1st time breastfeeding;Primapara   Initial consult at 8 hrs old with mom of NICU baby.  GA 34.1; BW 4 lbs, 2.5 oz.  Infant in NICU. Mom has flat nipples that dimples slightly in center.  Taught hand expression with return demonstration and observation of colostrum beginning to slowly flow.  Small drops collected in colostrum container. DEBP set up and taught how to pump on "initiate" setting turning dial up to 3-4 drops and using hands-on pumping. Encouraged an additional 10+ minutes of hand expression at end of pumping session to collect additional colostrum.   Reviewed NICU booklet, milk storage guidelines, and transporting milk to hospital. Reviewed with dad how to set up pump pieces and how to properly clean pieces, label, and appropriate stickers.  Labels and yellow dot stickers in room. Mom has insurance; discussed calling insurance company to inquire about obtaining insurance pump. Encouraged dad to visit "[employer]" downstairs to obtain pump for discharge to home while parents await for insurance pump to arrive in mail.   Encouraged STS in NICU, latching in NICU, and using NICU pumping rooms when visiting baby.   Encouraged to call for questions.     Maternal Data Has patient been taught Hand Expression?: Yes (with return demonstration and observation of colostrum; small drops collected for NICU) Does the patient have breastfeeding experience prior to this delivery?: No  Feeding    LATCH Score       Type of Nipple: Flat           Interventions    Lactation Tools Discussed/Used Tools: Pump;Coconut oil Breast pump type: Double-Electric Breast Pump WIC Program: No Pump Review: Setup, frequency, and cleaning;Milk Storage Initiated by:: Karen Sis, RN, MSN, IBCLC @ 8  hrs after delivery Date initiated:: 12/16/16   Consult Status Consult Status: Follow-up    Karen Quinn 12/16/2016, 1:09 PM

## 2016-12-17 ENCOUNTER — Inpatient Hospital Stay (HOSPITAL_COMMUNITY): Payer: BLUE CROSS/BLUE SHIELD

## 2016-12-17 ENCOUNTER — Encounter (HOSPITAL_COMMUNITY): Payer: Self-pay | Admitting: Radiology

## 2016-12-17 LAB — COMPREHENSIVE METABOLIC PANEL
ALBUMIN: 2.2 g/dL — AB (ref 3.5–5.0)
ALK PHOS: 143 U/L — AB (ref 38–126)
ALT: 204 U/L — AB (ref 14–54)
ANION GAP: 5 (ref 5–15)
AST: 124 U/L — AB (ref 15–41)
BUN: 9 mg/dL (ref 6–20)
CALCIUM: 6.7 mg/dL — AB (ref 8.9–10.3)
CO2: 25 mmol/L (ref 22–32)
Chloride: 104 mmol/L (ref 101–111)
Creatinine, Ser: 0.49 mg/dL (ref 0.44–1.00)
GFR calc Af Amer: >60 mL/min (ref >60)
GFR calc non Af Amer: >60 mL/min (ref >60)
GLUCOSE: 79 mg/dL (ref 65–99)
Potassium: 3.3 mmol/L — ABNORMAL LOW (ref 3.5–5.1)
SODIUM: 134 mmol/L — AB (ref 135–145)
Total Bilirubin: 0.2 mg/dL — ABNORMAL LOW (ref 0.3–1.2)
Total Protein: 4.6 g/dL — ABNORMAL LOW (ref 6.5–8.1)

## 2016-12-17 LAB — CBC
HCT: 27.4 % — ABNORMAL LOW (ref 36.0–46.0)
HEMOGLOBIN: 9.2 g/dL — AB (ref 12.0–15.0)
MCH: 29.1 pg (ref 26.0–34.0)
MCHC: 33.6 g/dL (ref 30.0–36.0)
MCV: 86.7 fL (ref 78.0–100.0)
Platelets: 179 10*3/uL (ref 150–400)
RBC: 3.16 MIL/uL — ABNORMAL LOW (ref 3.87–5.11)
RDW: 13.4 % (ref 11.5–15.5)
WBC: 9 10*3/uL (ref 4.0–10.5)

## 2016-12-17 MED ORDER — IOPAMIDOL (ISOVUE-300) INJECTION 61%
50.0000 mL | Freq: Once | INTRAVENOUS | Status: AC | PRN
  Administered 2016-12-17: 75 mL via INTRAVENOUS

## 2016-12-17 MED ORDER — FERROUS GLUCONATE 324 (38 FE) MG PO TABS
324.0000 mg | ORAL_TABLET | Freq: Every day | ORAL | Status: DC
  Administered 2016-12-17 – 2016-12-19 (×3): 324 mg via ORAL
  Filled 2016-12-17 (×4): qty 1

## 2016-12-17 NOTE — Progress Notes (Signed)
Postpartum day #1, NSVD  Subjective Blurry vision improved this am now that she is off Magnesium.  Reports no headache, no blurry vision, no RUQ pain.  Tolerating gen diet.  Voiding and ambulating without problems.  +flatus, no BM.  Lochia moderate  Temp:  [97.6 F (36.4 C)-99.1 F (37.3 C)] 98.2 F (36.8 C) (10/16 0400) Pulse Rate:  [62-71] 65 (10/16 0400) Resp:  [14-19] 17 (10/16 0400) BP: (116-135)/(73-83) 126/80 (10/16 0400) SpO2:  [92 %-98 %] 97 % (10/16 0400)  UOP: 2225/8hr  Gen:  NAD, A&O x 3 CV:  RRR Lungs:  CTA bilaterally Uterine fundus:  Firm, nontender Lochia normal Ext:  No edema, no calf tenderness bilaterally  CBC Latest Ref Rng & Units 12/16/2016 12/16/2016 12/15/2016  WBC 4.0 - 10.5 K/uL 10.1 9.5 9.0  Hemoglobin 12.0 - 15.0 g/dL 8.2(L) 9.0(L) 8.3(L)  Hematocrit 36.0 - 46.0 % 24.1(L) 26.9(L) 24.8(L)  Platelets 150 - 400 K/uL 151 168 156    AST/ALT- 158/195--> 160/174 --> 154/164  A/P: 31yo G1P0101 s/p NSVD complicated by preeclampsia with severe features  S/p SVD.  Baby in NICU. Preeclampsia with severe features.  BPs normal.  LFTS elevated- plan to recheck this am  -s/p magnesium for seizure prophylaxis.    -UOP improved  -pt now asymptomatic Routine postpartum care. Lactation support.  DISPO: Continue routine postpartum care as above, continue to monitor LFT trend  Myna Hidalgo, DO [phone] (cell) [phone] (office)

## 2016-12-17 NOTE — Progress Notes (Signed)
Dr. Charlotta Newton notified of change in status.  Patient reports increase in blurred vision.  Upon assessment, patient has approx a 3 foot visual field.  Puffiness of under eyes noted.  Increase in ALT level reported to Dr. Charlotta Newton.    No new orders at this time.  Continuing to monitor.

## 2016-12-17 NOTE — Lactation Note (Addendum)
This note was copied from a baby's chart.  Lactation Consultation Note  Patient Name: Karen Quinn Date: 12/17/2016 Reason for consult: Follow-up assessment;Late-preterm 34-36.6wks;NICU baby;1st time breastfeeding;Primapara;Other (Comment) (DEBP X2 in the last 24 hours per mom , encouraged to increase to at least 6 today or 8 is the goal ) Baby is 44 hours old .  Per mom having issues with blurred vision today,  LC discussed the importance of supply and demand and the importance of consistent pumping around the clock.  Also that the 1st 2 weeks are the most important for breast feeding ans establishing milk supply .  LC encouraged hand expressing at one power pump a day ( 10 mins on / 10 mins off over 60 mins , or 20 mins on , 10 mins off over 60 mins. )  LC reported to the [facility] RN mom needs to increase her consistent pumping.    Maternal Data Has patient been taught Hand Expression?: Yes  Feeding Feeding Type: Donor Breast Milk Length of feed: 30 min  LATCH Score                   Interventions Interventions: Breast feeding basics reviewed  Lactation Tools Discussed/Used Tools: Pump Breast pump type: Double-Electric Breast Pump   Consult Status Consult Status: Follow-up Date: 12/18/16 Follow-up type: In-patient    Matilde Sprang Sumayya Muha 12/17/2016, 1:22 PM

## 2016-12-17 NOTE — Progress Notes (Signed)
Dr. Charlotta Newton speaking with patient via phone at this time.

## 2016-12-18 LAB — COMPREHENSIVE METABOLIC PANEL
ALT: 161 U/L — AB (ref 14–54)
AST: 64 U/L — AB (ref 15–41)
Albumin: 2.4 g/dL — ABNORMAL LOW (ref 3.5–5.0)
Alkaline Phosphatase: 153 U/L — ABNORMAL HIGH (ref 38–126)
Anion gap: 7 (ref 5–15)
BUN: 12 mg/dL (ref 6–20)
CHLORIDE: 106 mmol/L (ref 101–111)
CO2: 23 mmol/L (ref 22–32)
CREATININE: 0.42 mg/dL — AB (ref 0.44–1.00)
Calcium: 8.1 mg/dL — ABNORMAL LOW (ref 8.9–10.3)
GFR calc Af Amer: >60 mL/min (ref >60)
Glucose, Bld: 89 mg/dL (ref 65–99)
POTASSIUM: 3.7 mmol/L (ref 3.5–5.1)
SODIUM: 136 mmol/L (ref 135–145)
Total Bilirubin: 0.4 mg/dL (ref 0.3–1.2)
Total Protein: 5.3 g/dL — ABNORMAL LOW (ref 6.5–8.1)

## 2016-12-18 LAB — GLUCOSE, CAPILLARY: GLUCOSE-CAPILLARY: 85 mg/dL (ref 65–99)

## 2016-12-18 MED ORDER — NIFEDIPINE ER OSMOTIC RELEASE 30 MG PO TB24
30.0000 mg | ORAL_TABLET | Freq: Every day | ORAL | Status: DC
  Administered 2016-12-18 – 2016-12-19 (×2): 30 mg via ORAL
  Filled 2016-12-18 (×2): qty 1

## 2016-12-18 NOTE — Progress Notes (Signed)
Dr. Charlotta Newtonzan notified of BP 173/100.  To treat with Labetalol protocol.

## 2016-12-18 NOTE — Progress Notes (Signed)
Pt complaining of dizziness and "pressure in the ears" upon sitting up.  BP 117/73.  CBG 85.    Symptoms spontaneously resolved.

## 2016-12-18 NOTE — Progress Notes (Signed)
Postpartum day #2, NSVD  Subjective Pt resting comfortably in bed.  Today, she notes improvement of her vision.  Denies headache, blurry vision, no RUQ pain.  Tolerating gen diet.  Voiding and ambulating without problems.  +flatus, + BM.  Lochia moderate.  No acute complaints this am  Temp:  [98.5 F (36.9 C)-99.2 F (37.3 C)] 98.7 F (37.1 C) (10/17 0607) Pulse Rate:  [59-72] 62 (10/17 0607) Resp:  [16-18] 18 (10/17 0607) BP: (108-163)/(69-95) 163/89 (10/17 0607) SpO2:  [95 %-99 %] 95 % (10/17 0607)    Gen:  NAD, A&O x 3 CV:  RRR Lungs:  CTA bilaterally Uterine fundus:  Firm, nontender, below umbilicus Lochia normal Ext:  1+ edema, no calf tenderness bilaterally, 1+ clonus, 3+ DTRs bilaterally  CBC Latest Ref Rng & Units 12/17/2016 12/16/2016 12/16/2016  WBC 4.0 - 10.5 K/uL 9.0 10.1 9.5  Hemoglobin 12.0 - 15.0 g/dL 5.9(D9.2(L) 6.3(O8.2(L) 7.5(I9.0(L)  Hematocrit 36.0 - 46.0 % 27.4(L) 24.1(L) 26.9(L)  Platelets 150 - 400 K/uL 179 151 168    AST/ALT- 158/195--> 160/174 --> 154/164--> 64/161  A/P: 31yo G1P0101 s/p NSVD complicated by preeclampsia with severe features, PPD#2  S/p SVD.  Baby in NICU. Preeclampsia with severe features.    -s/p magnesium for seizure prophylaxis.    -UOP much improved  -pt now asymptomatic  -BP increasing overnight, plan to start on Procardia XL 30mg  daily Routine postpartum care. Lactation support.  DISPO: Continue routine postpartum care as above, discharge dependent upon BPs today  Myna HidalgoJennifer Maryjean Corpening, DO [phone] (cell) [phone] (office)

## 2016-12-18 NOTE — Plan of Care (Signed)
Problem: Coping: Goal: Ability to cope will improve Outcome: Progressing Patient coping to NICU status of baby.  Patient and husband concerned regarding increased blood pressures.  Coping behaviors demonstrated are appropriate.   Problem: Bowel/Gastric: Goal: Gastrointestinal status will improve Outcome: Progressing Patient passing flatus.  Last BM noted yesterday  Problem: Respiratory: Goal: Ability to maintain adequate ventilation will improve Outcome: Completed/Met Date Met: 12/18/16 Lung sounds bilaterally clear. Patient maintaining adequate ventilation.   Problem: Urinary Elimination: Goal: Ability to reestablish a normal urinary elimination pattern will improve Outcome: Progressing Patient diuresing copious amounts of urine.

## 2016-12-18 NOTE — Lactation Note (Signed)
This note was copied from a baby's chart. Lactation Consultation Note  Patient Name: Boy Graceann CongressJohana Wierman RUEAV'WToday's Date: 12/18/2016 Reason for consult: Follow-up assessment;Late-preterm 34-36.6wks;NICU baby;1st time breastfeeding   Follow up with mom of 56 hour old NICU infant. Parents report mom is attempting to pump every 2-3 hours. Mom was able to express 1-2 cc colostrum after pumping this morning. Dad reports mom is getting more with hand expression, reviewed that this it normal. Reviewed normal progression of milk coming to volume. Dad reports mom was able to express more after holding infant yesterday, enc STS and pumping after visiting infant.  Mom reports she does not have a pump at home. Parents to call insurance to ask if they provide a pump. Parents were informed of breast pump rentals from the hospital gift shop. Discussed that NICU has pumping rooms to use when visiting infant after d/c and that all pump parts and tubing belong to mom and advised then to take them home to use with NICU pumps.   Parents with no further questions/concerns at this time.    Maternal Data Formula Feeding for Exclusion: No Has patient been taught Hand Expression?: Yes Does the patient have breastfeeding experience prior to this delivery?: No  Feeding    LATCH Score                   Interventions    Lactation Tools Discussed/Used Breast pump type: Double-Electric Breast Pump WIC Program: No Pump Review: Setup, frequency, and cleaning Initiated by:: Reviewed and encouraged every 2-3 hours   Consult Status Consult Status: Follow-up Date: 12/19/16 Follow-up type: In-patient    Silas FloodSharon S Lewanda Perea 12/18/2016, 12:14 PM

## 2016-12-18 NOTE — Progress Notes (Signed)
 20mg  Labetalol IV given for BP 173/100.  BP check in 10 minutes.

## 2016-12-19 MED ORDER — FERROUS GLUCONATE 324 (38 FE) MG PO TABS: 324.0000 mg | ORAL_TABLET | Freq: Every day | ORAL | 3 refills | Status: AC

## 2016-12-19 MED ORDER — NIFEDIPINE ER 30 MG PO TB24: 30.0000 mg | ORAL_TABLET | Freq: Every day | ORAL | 3 refills | Status: AC

## 2016-12-19 MED ORDER — IBUPROFEN 600 MG PO TABS: 600.0000 mg | ORAL_TABLET | Freq: Four times a day (QID) | ORAL | 0 refills | Status: AC

## 2016-12-19 NOTE — Progress Notes (Signed)
Discharge instructions given to patient and husband.  Discussed postpartum care, follow up appointment and medications.  Reviewed hypertension symptoms including headache, blurred vision, RUQ upper epigastric pain etc.

## 2016-12-19 NOTE — Discharge Instructions (Signed)
Home Care Instructions for Mom ACTIVITY  Gradually return to your regular activities.  Let yourself rest. Nap while your baby sleeps.  Avoid lifting anything that is heavier than 10 lb (4.5 kg) until your health care provider says it is okay.  Avoid activities that take a lot of effort and energy (are strenuous) until approved by your health care provider. Walking at a slow-to-moderate pace is usually safe.  If you had a cesarean delivery: ? Do not vacuum, climb stairs, or drive a car for 4-6 weeks. ? Have someone help you at home until you feel like you can do your usual activities yourself. ? Do exercises as told by your health care provider, if this applies.  MEDICATIONS: -Take procardia daily -For pain management- take ibuprofen or tylenol as needed -continue with iron daily  VAGINAL BLEEDING You may continue to bleed for 4-6 weeks after delivery. Over time, the amount of blood usually decreases and the color of the blood usually gets lighter. However, the flow of bright red blood may increase if you have been too active. If you need to use more than one pad in an hour because your pad gets soaked, or if you pass a large clot:  Lie down.  Raise your feet.  Place a cold compress on your lower abdomen.  Rest.  Call your health care provider.  If you are breastfeeding, your period should return anytime between 8 weeks after delivery and the time that you stop breastfeeding. If you are not breastfeeding, your period should return 6-8 weeks after delivery. PERINEAL CARE The perineal area, or perineum, is the part of your body between your thighs. After delivery, this area needs special care. Follow these instructions as told by your health care provider.  Take warm tub baths for 15-20 minutes.  Use medicated pads and pain-relieving sprays and creams as told.  Do not use tampons or douches until vaginal bleeding has stopped.  Each time you go to the bathroom: ? Use a peri  bottle. ? Change your pad. ? Use towelettes in place of toilet paper until your stitches have healed.  Do Kegel exercises every day. Kegel exercises help to maintain the muscles that support the vagina, bladder, and bowels. You can do these exercises while you are standing, sitting, or lying down. To do Kegel exercises: ? Tighten the muscles of your abdomen and the muscles that surround your birth canal. ? Hold for a few seconds. ? Relax. ? Repeat until you have done this 5 times in a row.  To prevent hemorrhoids from developing or getting worse: ? Drink enough fluid to keep your urine clear or pale yellow. ? Avoid straining when having a bowel movement. ? Take over-the-counter medicines and stool softeners as told by your health care provider.  BREAST CARE  Wear a tight-fitting bra.  Avoid taking over-the-counter pain medicine for breast discomfort.  Apply ice to the breasts to help with discomfort as needed: ? Put ice in a plastic bag. ? Place a towel between your skin and the bag. ? Leave the ice on for 20 minutes or as told by your health care provider.  NUTRITION  Eat a well-balanced diet.  Do not try to lose weight quickly by cutting back on calories.  Take your prenatal vitamins until your postpartum checkup or until your health care provider tells you to stop.  POSTPARTUM DEPRESSION You may find yourself crying for no apparent reason and unable to cope with all of the changes  that come with having a newborn. This mood is called postpartum depression. Postpartum depression happens because your hormone levels change after delivery. If you have postpartum depression, get support from your partner, friends, and family. If the depression does not go away on its own after several weeks, contact your health care provider. BREAST SELF-EXAM Do a breast self-exam each month, at the same time of the month. If you are breastfeeding, check your breasts just after a feeding, when your  breasts are less full. If you are breastfeeding and your period has started, check your breasts on day 5, 6, or 7 of your period. Report any lumps, bumps, or discharge to your health care provider. Know that breasts are normally lumpy if you are breastfeeding. This is temporary, and it is not a health risk. INTIMACY AND SEXUALITY Avoid sexual activity for at least 3-4 weeks after delivery or until the brownish-red vaginal flow is completely gone. If you want to avoid pregnancy, use some form of birth control. You can get pregnant after delivery, even if you have not had your period. SEEK MEDICAL CARE IF:  You feel unable to cope with the changes that a child brings to your life, and these feelings do not go away after several weeks.  You notice a lump, a bump, or discharge on your breast.  SEEK IMMEDIATE MEDICAL CARE IF:  Blood soaks your pad in 1 hour or less.  You have: ? Severe pain or cramping in your lower abdomen. ? A bad-smelling vaginal discharge. ? A fever that is not controlled by medicine. ? A fever, and an area of your breast is red and sore. ? Pain or redness in your calf. ? Sudden, severe chest pain. ? Shortness of breath. ? Painful or bloody urination. ? Problems with your vision.  You vomit for 12 hours or longer.  You develop a severe headache.  You have serious thoughts about hurting yourself, your child, or anyone else.  This information is not intended to replace advice given to you by your health care provider. Make sure you discuss any questions you have with your health care provider. Document Released: 02/16/2000 Document Revised: 07/27/2015 Document Reviewed: 08/22/2014 Elsevier Interactive Patient Education  2017 [employer]

## 2016-12-19 NOTE — Discharge Summary (Signed)
OB Discharge Summary     Patient Name: Karen CongressJohana Maxson DOB: 02/17/1986 MRN: [id number]  Date of admission: 12/12/2016 Delivering MD: Rolm BookbinderMOSS, AMBER   Date of discharge: 12/19/2016  Admitting diagnosis: 32wks Leaking fluid Doc  sent over Intrauterine pregnancy: 5038w1d     Secondary diagnosis:  Active Problems:   Preterm premature rupture of membranes  Additional problems: preeclampsia with severe features     Discharge diagnosis: Preterm Pregnancy Delivered and Preeclampsia (severe)                                                                                                Post partum procedures:n/a  Augmentation: Pitocin and Cytotec  Complications: None  Hospital course:  Induction of Labor With Vaginal Delivery   31 y.o. yo G1P0101 at 1538w1d was admitted to the hospital 12/12/2016 for induction of labor.  Indication for induction: PROM.  During labor, she was noted to have an elevated blood pressure and diagnosed with preeclampsia with severe features based on lab work.  Membrane Rupture Time/Date: 1:30 PM ,12/12/2016   Intrapartum Procedures: Episiotomy: None [1]                                         Lacerations:  2nd degree [3]  Patient had delivery of a Viable infant.  Information for the patient's newborn:  Herbert SetaCharles, Boy Jeraline [[id number]][[id number]]      12/16/2016  Details of delivery can be found in separate delivery note.  She was treated with IV Magnesium postpartum for 24hrs.  Due persistent change in vision, a Head CT was performed, which showed no abnormalities.  Additionally, her LFTs remained elevated following delivery and started to decline on PPD#2.  On her 2nd postpartum day, she was also noted to have a rise in blood pressure and was started on Procardia XL 30mg  daily.  She was discharged home in stable condition on PPD#3, 12/19/16.  Physical exam  Vitals:   12/18/16 2157 12/18/16 2340 12/19/16 0420 12/19/16 0743  BP: 124/81 119/88 126/80 (!) 147/94  Pulse: 78 70 65  (!) 57  Resp: 18 18 18 18   Temp: 99.4 F (37.4 C) 99.2 F (37.3 C) 98.4 F (36.9 C) 98.1 F (36.7 C)  TempSrc: Oral Oral Oral Oral  SpO2: 98% 100% 100% 99%  Weight:      Height:       General: alert, cooperative and no distress Lochia: appropriate Uterine Fundus: firm Incision: N/A DVT Evaluation: No evidence of DVT seen on physical exam. Labs: Lab Results  Component Value Date   WBC 9.0 12/17/2016   HGB 9.2 (L) 12/17/2016   HCT 27.4 (L) 12/17/2016   MCV 86.7 12/17/2016   PLT 179 12/17/2016   CMP Latest Ref Rng & Units 12/18/2016  Glucose 65 - 99 mg/dL 89  BUN 6 - 20 mg/dL 12  Creatinine 3.080.44 - 6.571.00 mg/dL 8.46(N0.42(L)  Sodium 629135 - 528145 mmol/L 136  Potassium 3.5 - 5.1 mmol/L 3.7  Chloride 101 - 111 mmol/L 106  CO2 22 - 32 mmol/L 23  Calcium 8.9 - 10.3 mg/dL 8.1(L)  Total Protein 6.5 - 8.1 g/dL 5.3(L)  Total Bilirubin 0.3 - 1.2 mg/dL 0.4  Alkaline Phos 38 - 126 U/L 153(H)  AST 15 - 41 U/L 64(H)  ALT 14 - 54 U/L 161(H)    Discharge instruction: per After Visit Summary and "Baby and Me Booklet".  After visit meds:  Allergies as of 12/19/2016   No Known Allergies     Medication List    TAKE these medications   ferrous gluconate 324 MG tablet Commonly known as:  FERGON Take 1 tablet (324 mg total) by mouth daily with breakfast.   ibuprofen 600 MG tablet Commonly known as:  ADVIL,MOTRIN Take 1 tablet (600 mg total) by mouth every 6 (six) hours.   NIFEdipine 30 MG 24 hr tablet Commonly known as:  PROCARDIA-XL/ADALAT CC Take 1 tablet (30 mg total) by mouth daily.   prenatal multivitamin Tabs tablet Take 1 tablet by mouth daily at 12 noon.       Diet: routine diet  Activity: Advance as tolerated. Pelvic rest for 6 weeks.   Outpatient follow up:2 weeks Follow up Appt:No future appointments. Follow up Visit:No Follow-up on file.  Postpartum contraception: Undecided  Newborn Data: Live born female  Birth Weight: 4 lb 2.5 oz (1885 g) APGAR: 8,  9  Newborn Delivery   Birth date/time:  12/16/2016 03:53:00 Delivery type:  Vaginal, Spontaneous Delivery      Baby Feeding: Breast Disposition: NICU   12/19/2016 Myna Hidalgo, M, DO

## 2016-12-19 NOTE — Lactation Note (Signed)
This note was copied from a baby's chart. Lactation Consultation Note  Patient Name: Karen Quinn ZOXWR'UToday's Date: 12/19/2016 Reason for consult: Follow-up assessment;Other (Comment);NICU baby;Primapara;1st time breastfeeding Baby is 3579 hours old  Mom is for D/C today  Post Magso4 - will be D/C on Procardia for high B/P.  pe rmom has pumped x 6 in the last 24 hours with 2-4 ml EBM yield.  LC encourage mom to increase to 8 x's in 24 hours ( around the clock )  If she has a day where it is difficult to pump the 8x's , in the evening power pump x 1  ( 20 mins on 10 mins off , or 10 mins on 10 mins off over 60 mins.  Sore nipple and engorgement prevention and tx reviewed.  Per mom and dad , either plan to rent a DEBP from the gift shop or  LC recommended since they ar [employer] to call them today and see If there is a medical supply facility in the [place] where they can get their DEBP today.  LC also recommended when visiting the baby in NICU to plan on pumping in the pumping rooms.  Seeing the baby will help the let down.  LC stressed the importance of the 1st 2 weeks of breast feeding and pumping and establishing milk  Supply. Consistent pumping is the key , along with pumping.  Mother informed of post-discharge support and given phone number to the lactation department, including services for phone call assistance; out-patient appointments; and breastfeeding support group. List of other breastfeeding resources in the community given in the handout. Encouraged mother to call for problems or concerns related to breastfeeding.  Maternal Data Has patient been taught Hand Expression?: Yes  Feeding Feeding Type: Donor Breast Milk Length of feed: 60 min  LATCH Score                   Interventions Interventions: Breast feeding basics reviewed  Lactation Tools Discussed/Used     Consult Status Consult Status: PRN Follow-up type: Other (comment) (in NICU  )    Matilde SprangMargaret Ann Gio Janoski 12/19/2016, 11:44 AM

## 2016-12-19 NOTE — Progress Notes (Signed)
Postpartum day #3, NSVD  Subjective Pt resting comfortably in bed. Vision now back to normal. Denies headache, blurry vision, no RUQ pain.  Tolerating gen diet.  Voiding and ambulating without problems.  +flatus, + BM.  Lochia moderate.  No acute complaints this am  Temp:  [98.4 F (36.9 C)-99.4 F (37.4 C)] 98.4 F (36.9 C) (10/18 0420) Pulse Rate:  [62-91] 65 (10/18 0420) Resp:  [16-20] 18 (10/18 0420) BP: (107-173)/(73-100) 126/80 (10/18 0420) SpO2:  [98 %-100 %] 100 % (10/18 0420)    Gen:  NAD, A&O x 3 CV:  RRR Lungs:  CTA bilaterally Uterine fundus:  Firm, nontender, below umbilicus Lochia normal Ext: Edema improved, no calf tenderness bilaterally  CBC Latest Ref Rng & Units 12/17/2016 12/16/2016 12/16/2016  WBC 4.0 - 10.5 K/uL 9.0 10.1 9.5  Hemoglobin 12.0 - 15.0 g/dL 9.6(E9.2(L) 4.5(W8.2(L) 0.9(W9.0(L)  Hematocrit 36.0 - 46.0 % 27.4(L) 24.1(L) 26.9(L)  Platelets 150 - 400 K/uL 179 151 168    AST/ALT- 158/195--> 160/174 --> 154/164--> 64/161  A/P: 31yo G1P0101 s/p NSVD complicated by preeclampsia with severe features, PPD#3 S/p SVD.  Baby in NICU. Preeclampsia with severe features.    -s/p magnesium for seizure prophylaxis.    -UOP much improved  -pt now asymptomatic  -BP stable on Procardia XL 30mg  daily Routine postpartum care. Lactation support.  DISPO: meeting milestones appropriately, plan for discharge home today  Myna HidalgoJennifer Reida Hem, OhioDO [phone] (cell) [phone] (office)

## 2016-12-24 ENCOUNTER — Ambulatory Visit: Payer: Self-pay

## 2016-12-24 NOTE — Lactation Note (Signed)
This note was copied from a baby's chart. Lactation Consultation Note  Patient Name: Karen Quinn XBMWU'XToday's Date: 12/24/2016 Reason for consult: Follow-up assessment;NICU baby;Infant < 6lbs;Late-preterm 34-36.6wks   Follow up with mom of 8 day old LPT NICU infant. Was asked by speech therapist to assist mom with positioning and latch. Infant was asleep and being fed with NG tube while STS with mom. Assisted mom with positioning infant to left breast  In the cross cradle hold. Infant was not interested in feeding. Mom's nipple is very short shafted at rest. Gave mom # 20 NS and showed her how to apply NS for use in subsequent feedings. Enc mom to call for feeding assistance as needed.   Mom is pumping every 2-3 hours and getting about an oz/ pumping. Enc mom to offer breast to infant when mom is here and infant is showing interest. Discussed positioning and support of infant while at breast. Discussed normal feeding pattern for Preterm infant with parents. Parents voiced no questions/concerns at this time.    Maternal Data Formula Feeding for Exclusion: No Does the patient have breastfeeding experience prior to this delivery?: No  Feeding Feeding Type: Breast Fed Length of feed: 90 min  LATCH Score                   Interventions Interventions: Breast feeding basics reviewed;Support pillows;Assisted with latch;Position options  Lactation Tools Discussed/Used Tools: Nipple Shields Nipple shield size: 20 Pump Review: Setup, frequency, and cleaning;Milk Storage Initiated by:: reviewed and encouraged every 2-3   Consult Status Consult Status: PRN Follow-up type: Call as needed    Karen BlalockSharon S Rian Quinn 12/24/2016, 3:01 PM

## 2018-06-19 IMAGING — CT CT HEAD WO/W CM
2 of 3 series · 15 of 40 positions shown, 18 images · IV contrast (iopamidol)
Comparison: None.

CLINICAL DATA: 31-year-old postpartum female with blurred vision
and headache for 2 days.

EXAM:
CT HEAD WITHOUT AND WITH CONTRAST
TECHNIQUE: Contiguous axial images were obtained from the base of the skull
through the vertex without and with intravenous contrast
CONTRAST:  75mL [id number]-YOO IOPAMIDOL ([id number]) INJECTION 61%

[Series 7: routine head w · axial · 0.46mm/px · z∈[-25,+100]mm · 12 of 32 slices shown, 15 images]
[im 3/32  brain]
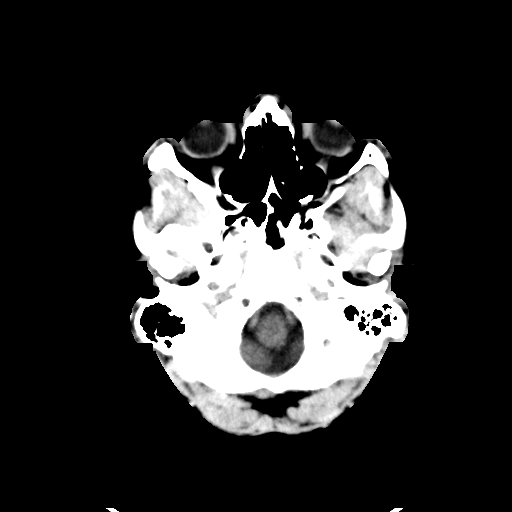
[im 3/32  bone]
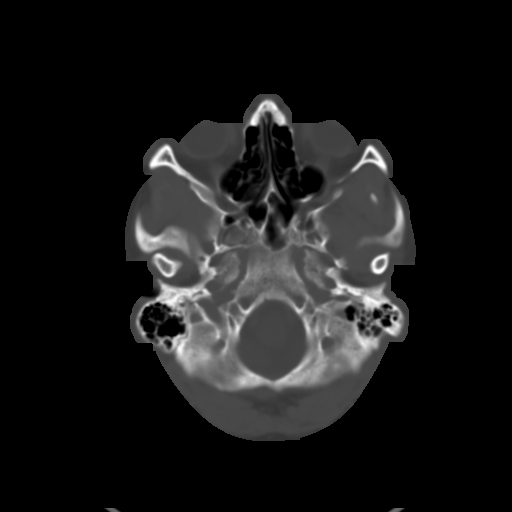
[im 5/32  brain]
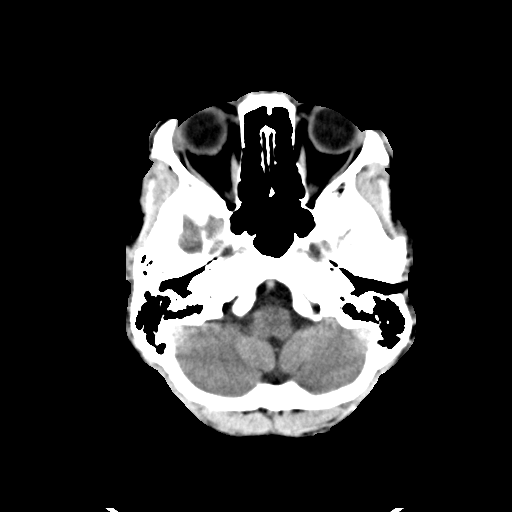
[im 7/32  brain]
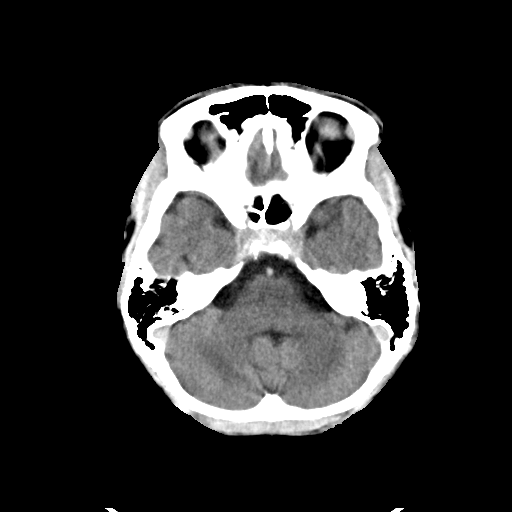
[im 9/32  brain]
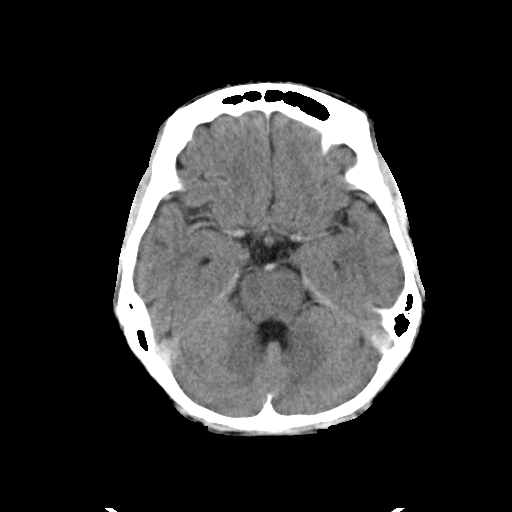
[im 12/32  brain]
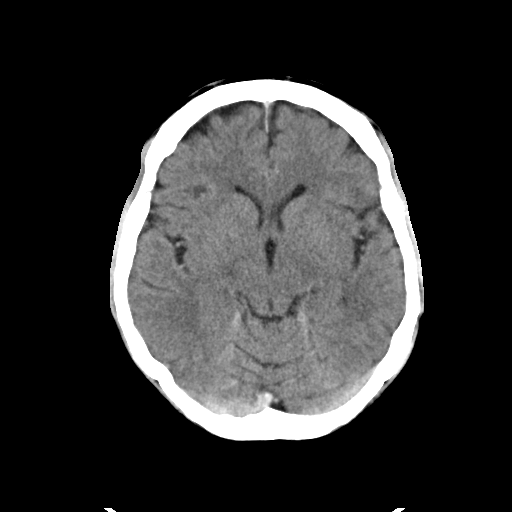
[im 12/32  bone]
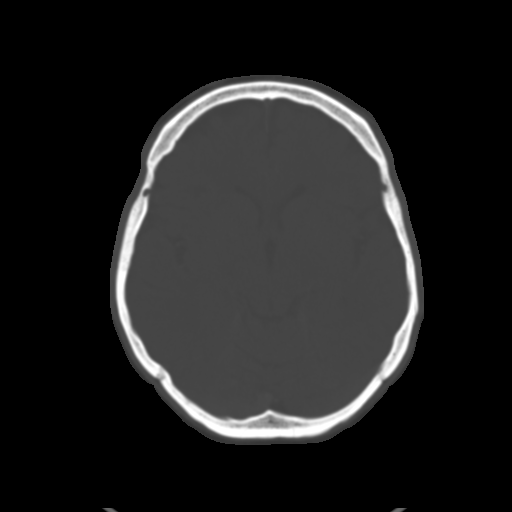
[im 14/32  brain]
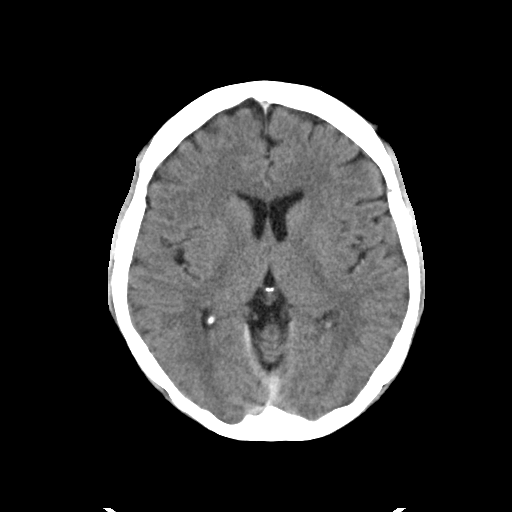
[im 18/32  brain]
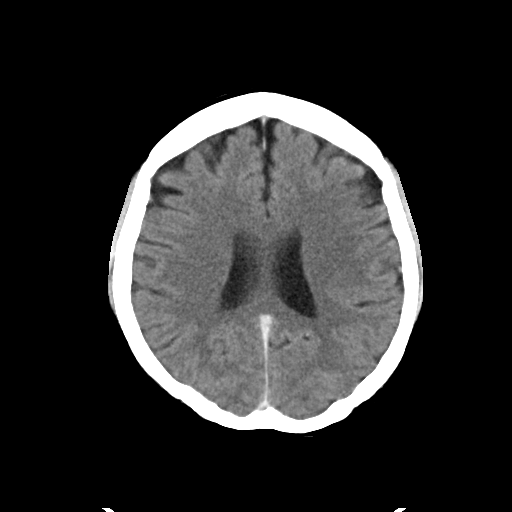
[im 20/32  brain]
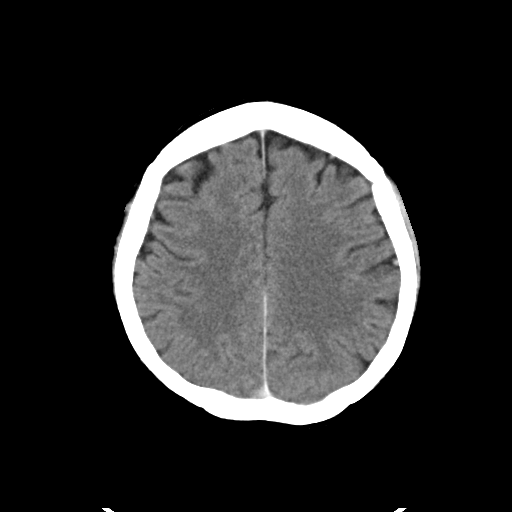
[im 23/32  brain]
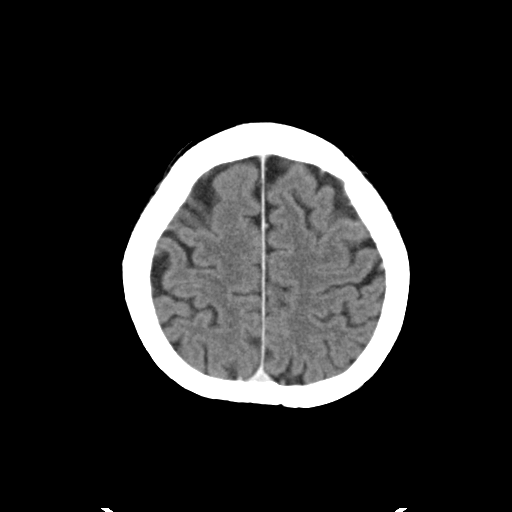
[im 23/32  bone]
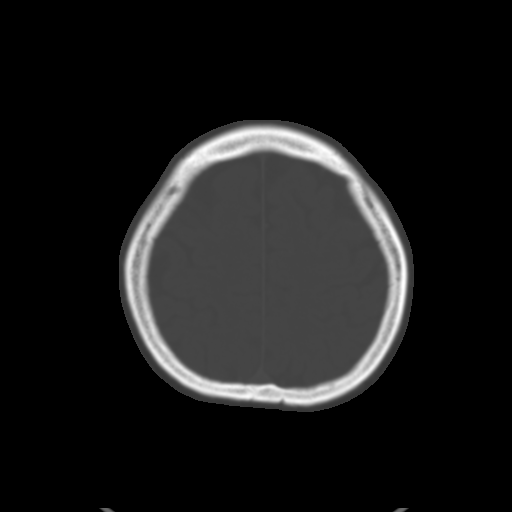
[im 25/32  brain]
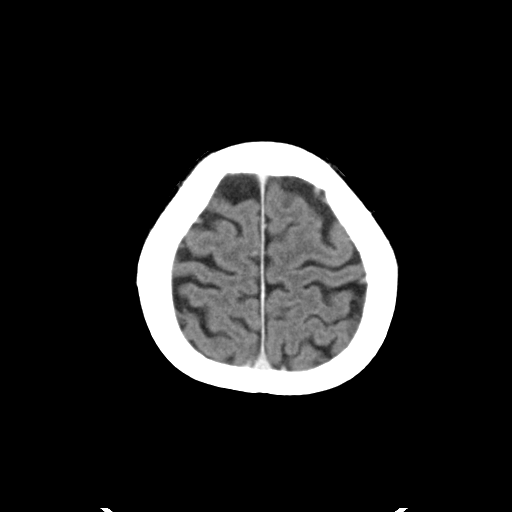
[im 27/32  brain]
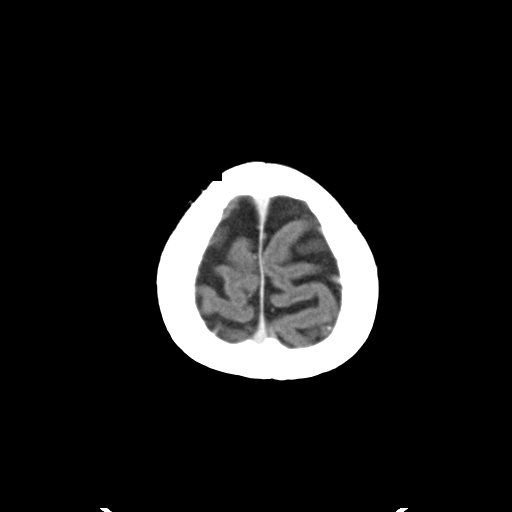
[im 29/32  brain]
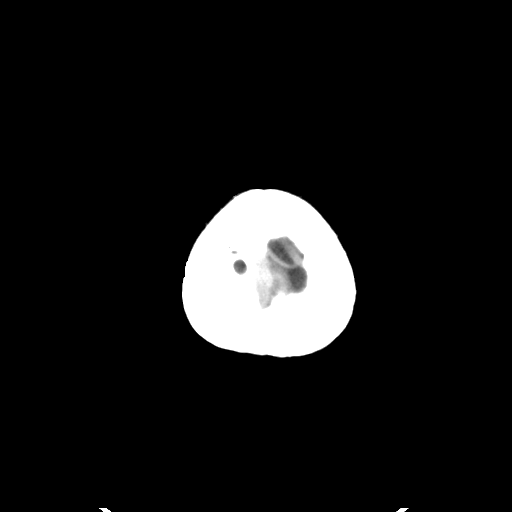

[Series 602: coronals · coronal · 0.46mm/px · 3 of 60 slices shown]
[im 20/60  brain]
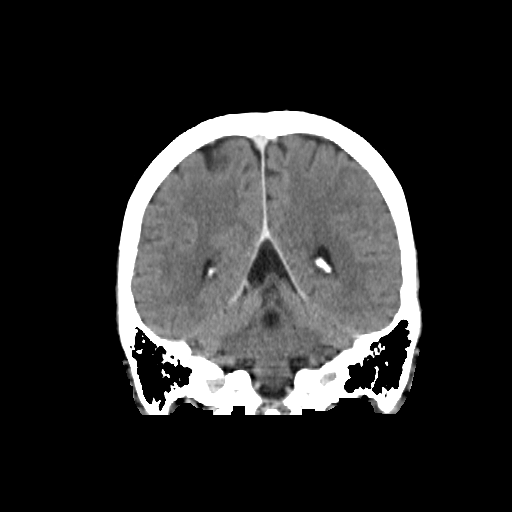
[im 27/60  brain]
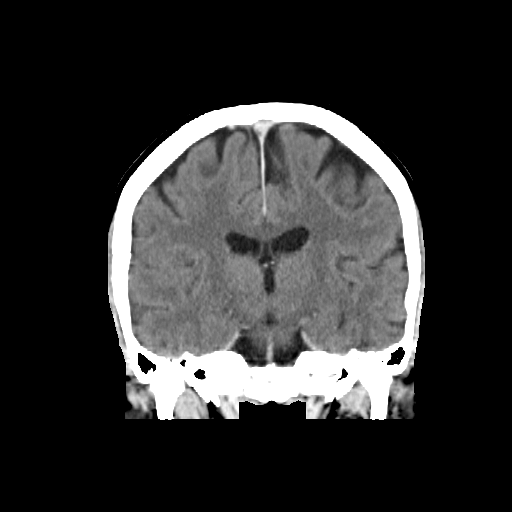
[im 40/60  brain]
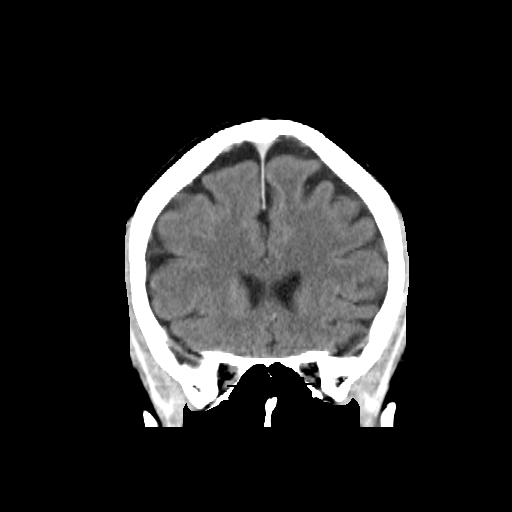

[15 of 40 positions shown; findings below may reference images not displayed]

FINDINGS: Brain: Cerebral volume is within normal limits. No midline shift,
ventriculomegaly, mass effect, evidence of mass lesion, intracranial
hemorrhage or evidence of cortically based acute infarction.
Gray-white matter differentiation is within normal limits throughout
the brain. No abnormal enhancement identified.

Vascular: Major intracranial vascular structures appear to be
enhancing as expected.

Skull: Negative.

Sinuses/Orbits: Clear.

Other: Visualized orbits and scalp soft tissues are within normal
limits.
IMPRESSION: Normal CT appearance of the brain.  No explanation for headache.

## 2024-01-12 ENCOUNTER — Other Ambulatory Visit: Payer: Self-pay | Admitting: Family Medicine

## 2024-01-12 DIAGNOSIS — N644 Mastodynia: Secondary | ICD-10-CM

## 2024-02-04 ENCOUNTER — Ambulatory Visit
Admission: RE | Admit: 2024-02-04 | Discharge: 2024-02-04 | Disposition: A | Source: Ambulatory Visit | Attending: Family Medicine

## 2024-02-04 ENCOUNTER — Other Ambulatory Visit: Payer: Self-pay | Admitting: Family Medicine

## 2024-02-04 DIAGNOSIS — N644 Mastodynia: Secondary | ICD-10-CM
